# Patient Record
Sex: Male | Born: 1970 | Race: White | Hispanic: No | Marital: Married | Smoking: Former smoker
Health system: Southern US, Community
[De-identification: ages and names within clinical notes are randomized; demographics above are authoritative.]

## PROBLEM LIST (undated history)

## (undated) HISTORY — PX: HAND SURGERY: SHX662

---

## 2002-09-16 ENCOUNTER — Emergency Department (HOSPITAL_COMMUNITY): Admission: EM | Admit: 2002-09-16 | Discharge: 2002-09-16 | Payer: Self-pay | Admitting: Emergency Medicine

## 2002-09-23 ENCOUNTER — Ambulatory Visit (HOSPITAL_BASED_OUTPATIENT_CLINIC_OR_DEPARTMENT_OTHER): Admission: RE | Admit: 2002-09-23 | Discharge: 2002-09-23 | Payer: Self-pay | Admitting: General Surgery

## 2018-01-12 ENCOUNTER — Other Ambulatory Visit: Payer: Self-pay

## 2018-01-12 ENCOUNTER — Encounter (HOSPITAL_BASED_OUTPATIENT_CLINIC_OR_DEPARTMENT_OTHER): Payer: Self-pay | Admitting: *Deleted

## 2018-01-12 ENCOUNTER — Emergency Department (HOSPITAL_BASED_OUTPATIENT_CLINIC_OR_DEPARTMENT_OTHER)
Admission: EM | Admit: 2018-01-12 | Discharge: 2018-01-12 | Disposition: A | Discharge status: Home / Self Care | Payer: Self-pay | Attending: Emergency Medicine | Admitting: Emergency Medicine

## 2018-01-12 DIAGNOSIS — Y9389 Activity, other specified: Secondary | ICD-10-CM | POA: Insufficient documentation

## 2018-01-12 DIAGNOSIS — T148XXA Other injury of unspecified body region, initial encounter: Secondary | ICD-10-CM

## 2018-01-12 DIAGNOSIS — S39012A Strain of muscle, fascia and tendon of lower back, initial encounter: Secondary | ICD-10-CM | POA: Insufficient documentation

## 2018-01-12 DIAGNOSIS — Y92410 Unspecified street and highway as the place of occurrence of the external cause: Secondary | ICD-10-CM | POA: Insufficient documentation

## 2018-01-12 DIAGNOSIS — S29012A Strain of muscle and tendon of back wall of thorax, initial encounter: Secondary | ICD-10-CM | POA: Insufficient documentation

## 2018-01-12 DIAGNOSIS — S161XXA Strain of muscle, fascia and tendon at neck level, initial encounter: Secondary | ICD-10-CM | POA: Insufficient documentation

## 2018-01-12 DIAGNOSIS — Z87891 Personal history of nicotine dependence: Secondary | ICD-10-CM | POA: Insufficient documentation

## 2018-01-12 DIAGNOSIS — Y998 Other external cause status: Secondary | ICD-10-CM | POA: Insufficient documentation

## 2018-01-12 MED ORDER — METHOCARBAMOL 500 MG PO TABS: 500.0000 mg | ORAL_TABLET | Freq: Two times a day (BID) | ORAL | 0 refills | Status: AC
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
## 2018-01-12 NOTE — ED Notes (Signed)
Aromatherapy for pain study initiated.  

## 2018-01-12 NOTE — ED Notes (Signed)
ED Provider at bedside. 

## 2018-01-12 NOTE — Discharge Instructions (Signed)
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
## 2018-05-03 ENCOUNTER — Emergency Department (HOSPITAL_BASED_OUTPATIENT_CLINIC_OR_DEPARTMENT_OTHER): Payer: Self-pay

## 2018-05-03 ENCOUNTER — Other Ambulatory Visit: Payer: Self-pay

## 2018-05-03 ENCOUNTER — Emergency Department (HOSPITAL_BASED_OUTPATIENT_CLINIC_OR_DEPARTMENT_OTHER)
Admission: EM | Admit: 2018-05-03 | Discharge: 2018-05-03 | Disposition: A | Discharge status: Home / Self Care | Payer: Self-pay | Attending: Emergency Medicine | Admitting: Emergency Medicine

## 2018-05-03 ENCOUNTER — Encounter (HOSPITAL_BASED_OUTPATIENT_CLINIC_OR_DEPARTMENT_OTHER): Payer: Self-pay | Admitting: Adult Health

## 2018-05-03 DIAGNOSIS — M25511 Pain in right shoulder: Secondary | ICD-10-CM | POA: Insufficient documentation

## 2018-05-03 DIAGNOSIS — Z87891 Personal history of nicotine dependence: Secondary | ICD-10-CM | POA: Insufficient documentation

## 2018-05-03 DIAGNOSIS — M542 Cervicalgia: Secondary | ICD-10-CM | POA: Insufficient documentation

## 2018-05-03 MED ORDER — METHOCARBAMOL 750 MG PO TABS: 750.0000 mg | ORAL_TABLET | Freq: Three times a day (TID) | ORAL | 0 refills | Status: AC | PRN
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
## 2018-05-03 NOTE — Discharge Instructions (Signed)
It was our pleasure to provide your ER care today - we hope that you feel better.  Take motrin or aleve as need for pain. You may take robaxin as need for muscle pain/spasm - no driving when taking.   Follow up with primary care doctor in the next couple of weeks - discuss possible physical therapy and/or orthopedic follow up if symptoms persist.
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
